# Patient Record
Sex: Male | Born: 1975 | Race: White | Hispanic: No | Marital: Single | Smoking: Never smoker
Health system: Southern US, Community
[De-identification: ages and names within clinical notes are randomized; demographics above are authoritative.]

## PROBLEM LIST (undated history)

## (undated) DIAGNOSIS — Z982 Presence of cerebrospinal fluid drainage device: Secondary | ICD-10-CM

## (undated) DIAGNOSIS — G809 Cerebral palsy, unspecified: Secondary | ICD-10-CM

---

## 2012-10-10 ENCOUNTER — Emergency Department (HOSPITAL_COMMUNITY): Payer: Medicaid Other

## 2012-10-10 ENCOUNTER — Emergency Department (HOSPITAL_COMMUNITY)
Admission: EM | Admit: 2012-10-10 | Discharge: 2012-10-10 | Disposition: A | Discharge status: Home / Self Care | Payer: Medicaid Other | Attending: Emergency Medicine | Admitting: Emergency Medicine

## 2012-10-10 ENCOUNTER — Encounter (HOSPITAL_COMMUNITY): Payer: Self-pay | Admitting: Emergency Medicine

## 2012-10-10 DIAGNOSIS — I10 Essential (primary) hypertension: Secondary | ICD-10-CM | POA: Insufficient documentation

## 2012-10-10 DIAGNOSIS — S99929A Unspecified injury of unspecified foot, initial encounter: Secondary | ICD-10-CM | POA: Insufficient documentation

## 2012-10-10 DIAGNOSIS — Z8679 Personal history of other diseases of the circulatory system: Secondary | ICD-10-CM | POA: Insufficient documentation

## 2012-10-10 DIAGNOSIS — S8990XA Unspecified injury of unspecified lower leg, initial encounter: Secondary | ICD-10-CM | POA: Insufficient documentation

## 2012-10-10 DIAGNOSIS — Z79899 Other long term (current) drug therapy: Secondary | ICD-10-CM | POA: Insufficient documentation

## 2012-10-10 DIAGNOSIS — G40909 Epilepsy, unspecified, not intractable, without status epilepticus: Secondary | ICD-10-CM | POA: Insufficient documentation

## 2012-10-10 DIAGNOSIS — W1809XA Striking against other object with subsequent fall, initial encounter: Secondary | ICD-10-CM | POA: Insufficient documentation

## 2012-10-10 DIAGNOSIS — Z23 Encounter for immunization: Secondary | ICD-10-CM | POA: Insufficient documentation

## 2012-10-10 DIAGNOSIS — Y9289 Other specified places as the place of occurrence of the external cause: Secondary | ICD-10-CM | POA: Insufficient documentation

## 2012-10-10 DIAGNOSIS — Z8669 Personal history of other diseases of the nervous system and sense organs: Secondary | ICD-10-CM | POA: Insufficient documentation

## 2012-10-10 DIAGNOSIS — M79671 Pain in right foot: Secondary | ICD-10-CM

## 2012-10-10 DIAGNOSIS — W19XXXA Unspecified fall, initial encounter: Secondary | ICD-10-CM

## 2012-10-10 DIAGNOSIS — Y9389 Activity, other specified: Secondary | ICD-10-CM | POA: Insufficient documentation

## 2012-10-10 HISTORY — DX: Cerebral palsy, unspecified: G80.9

## 2012-10-10 MED ORDER — TETANUS-DIPHTH-ACELL PERTUSSIS 5-2.5-18.5 LF-MCG/0.5 IM SUSP
0.5000 mL | Freq: Once | INTRAMUSCULAR | Status: AC
  Administered 2012-10-10: 0.5 mL via INTRAMUSCULAR
  Filled 2012-10-10: qty 0.5

## 2012-10-10 NOTE — ED Notes (Signed)
Placed call to [facility] for transport back to [facility] 

## 2012-10-10 NOTE — ED Provider Notes (Addendum)
History     CSN: [id number]  Arrival date & time 10/10/12  1530   First MD Initiated Contact with Patient 10/10/12 1608      Chief Complaint  Patient presents with  . 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  EMS placed him in a KED vest and C-collar.  He fell down 2 stairs in his wheelchair and hit his head falling face first.  He denies complaints (i.e pain, nausea, vomiting) and wants to go home.  He lives at [facility].   He complains of mild right heel pain after fall  Meds:  Tegretol 300 mg bid-Denies recent history of seizures Instill ear drops Mvt Lisinopril 2.5 mg qd Miralax  Colace   [facility] Right sided shunt  SH:  Only child, mother [phone]   Patient is a 37 y.o. male presenting with fall. The history is provided by the patient. No language interpreter was used.  Fall The accident occurred 1 to 2 hours ago. He landed on a hard floor. The point of impact was the head. Pertinent negatives include no abdominal pain, no nausea, no vomiting and no headaches.    Past Medical History  Diagnosis Date  . Cerebral palsy     History reviewed. No pertinent past surgical history.  History reviewed. No pertinent family history.  History  Substance Use Topics  . Smoking status: Never Smoker   . Smokeless tobacco: Not on file  . Alcohol Use: No      Review of Systems  Gastrointestinal: Negative for nausea, vomiting and abdominal pain.  Neurological: Negative for dizziness, seizures, light-headedness and headaches.  All other systems reviewed and are negative.    Allergies  Review of patient's allergies indicates no known allergies.  Home Medications   Current Outpatient Rx  Name  Route  Sig  Dispense  Refill  . CARBAMAZEPINE 100 MG PO CHEW  Oral   Chew 100 mg by mouth 2 (two) times daily.           BP 125/73  Pulse 85  Temp 98.8 F (37.1 C) (Oral)  Resp 14  SpO2 100%  Physical Exam  Nursing note and vitals reviewed. Constitutional: He is oriented to person, place, and time. Vital signs are normal. He appears well-developed and well-nourished. He is cooperative. No distress.  HENT:  Head: Normocephalic. Head is with abrasion.    Mouth/Throat: Oropharynx is clear and moist and mucous membranes are normal. No oropharyngeal exudate.  Eyes: Conjunctivae normal are normal. Pupils are equal, round, and reactive to light. Right eye exhibits no discharge. Left eye exhibits no discharge. No scleral icterus.  Cardiovascular: Normal rate, regular rhythm, S1 normal, S2 normal and normal heart sounds.   No murmur heard. Pulmonary/Chest: Effort normal and breath sounds normal. No respiratory distress. He has no wheezes.  Abdominal: Soft. Bowel sounds are normal. He exhibits no distension. There is no tenderness.         Scar to abdomen from shunt  Musculoskeletal:       Chronic contractures upper and lower extremities b/l  Neurological: He is alert and oriented to person, place, and time. He has normal strength.       Baseline motor strength  Skin: Skin is warm, dry and intact. No rash noted.  Psychiatric: He has a normal mood and affect. His speech is normal and behavior  is normal. Judgment and thought content normal. Cognition and memory are normal.       Baseline cognition and mental status     ED Course  Procedures (including critical care time)  Labs Reviewed - No data to display Dg Cervical Spine 2-3 Views  10/10/2012  *RADIOLOGY REPORT*  Clinical Data: Fall.  Motion degraded CT.  CERVICAL SPINE - 2-3 VIEW  Comparison: 10/10/2012 CT.  Findings: Mild straightening of the cervical spine with degenerative changes C5-6.  Slight tilt of the head.  No cervical spine fracture is noted.  Ventricular peritoneal shunt is in  place coursing over the right chest.  IMPRESSION: No cervical spine fracture.   Original Report Authenticated By: Lacy Duverney, M.D.    Ct Head Wo Contrast  10/10/2012  *RADIOLOGY REPORT*  Clinical Data:  Fall down 2 stairs falling out of wheelchair and hitting head.  Cerebral palsy.  CT HEAD WITHOUT CONTRAST CT CERVICAL SPINE WITHOUT CONTRAST  Technique:  Multidetector CT imaging of the head and cervical spine was performed following the standard protocol without intravenous contrast.  Multiplanar CT image reconstructions of the cervical spine were also generated.  Comparison:   None  CT HEAD  Findings: Motion degraded exam.  Exam repeated and still motion degraded.  Ventricular shunt enters from the right parietal region extending into the posterior aspect of the right lateral ventricle.  This causes significant streak artifact.  This combined with motion degradation limits evaluation.  No obvious intracranial hemorrhage or skull fracture.  Agenesis of the corpus callosum.  Atrophy without hydrocephalus.  No CT evidence of large acute infarct.  No intracranial mass lesion detected on this unenhanced exam.  IMPRESSION: Ventricular shunt enters from the right parietal region extending into the posterior aspect of the right lateral ventricle.  This causes significant streak artifact.  This combined with motion degradation limits evaluation.  No obvious intracranial hemorrhage or skull fracture.  Agenesis of the corpus callosum.  Atrophy without hydrocephalus.  Partial opacification inferior left mastoid air cells.  No fracture identified.  No mass seen at the left posterior-superior nasopharynx causing left sided eustachian tube dysfunction.  CT CERVICAL SPINE  Findings: Motion degraded exam.  Taking into account the limitation caused by motion artifact, no cervical spine fracture is noted.  Sagittal reconstructed imaging with indistinctness of the C2 vertebral body/dens junction.  If the patient has a significant  neck pain, plain film lateral view of the C2 level can be obtained to confirm that the appearance is related to motion rather than fracture.  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  IMPRESSION: Taking into account the limitation caused by motion artifact, no cervical spine fracture is noted.  Sagittal reconstructed imaging with indistinctness of the C2 vertebral body/dens junction.  If the patient has a significant neck pain, plain film lateral view of the C2 level can be obtained to confirm that the appearance is related to motion rather than fracture.  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   Original Report Authenticated By: Lacy Duverney, M.D.    Ct Cervical Spine Wo Contrast  10/10/2012  *RADIOLOGY REPORT*  Clinical Data:  Fall down 2 stairs falling out of wheelchair and hitting  head.  Cerebral palsy.  CT HEAD WITHOUT CONTRAST CT CERVICAL SPINE WITHOUT CONTRAST  Technique:  Multidetector CT imaging of the head and cervical spine was performed following the standard protocol without intravenous contrast.  Multiplanar CT image reconstructions of the cervical spine were also generated.  Comparison:   None  CT HEAD  Findings: Motion degraded exam.  Exam repeated and still motion degraded.  Ventricular shunt enters from the right parietal region extending into the posterior aspect of the right lateral ventricle.  This causes significant streak artifact.  This combined with motion degradation limits evaluation.  No obvious intracranial hemorrhage or skull fracture.  Agenesis of the corpus callosum.  Atrophy without hydrocephalus.  No CT evidence of large acute infarct.  No  intracranial mass lesion detected on this unenhanced exam.  IMPRESSION: Ventricular shunt enters from the right parietal region extending into the posterior aspect of the right lateral ventricle.  This causes significant streak artifact.  This combined with motion degradation limits evaluation.  No obvious intracranial hemorrhage or skull fracture.  Agenesis of the corpus callosum.  Atrophy without hydrocephalus.  Partial opacification inferior left mastoid air cells.  No fracture identified.  No mass seen at the left posterior-superior nasopharynx causing left sided eustachian tube dysfunction.  CT CERVICAL SPINE  Findings: Motion degraded exam.  Taking into account the limitation caused by motion artifact, no cervical spine fracture is noted.  Sagittal reconstructed imaging with indistinctness of the C2 vertebral body/dens junction.  If the patient has a significant neck pain, plain film lateral view of the C2 level can be obtained to confirm that the appearance is related to motion rather than fracture.  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  IMPRESSION: Taking into account the limitation caused by motion artifact, no cervical spine fracture is noted.  Sagittal reconstructed imaging with indistinctness of the C2 vertebral body/dens junction.  If the patient has a significant neck pain, plain film lateral view of the C2 level can be obtained to confirm that the appearance is related to motion rather than fracture.  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   Original Report Authenticated By: Lacy Duverney, M.D.    Dg Foot Complete Right  10/10/2012  *RADIOLOGY REPORT*   Clinical Data: Right heel pain after fall.  RIGHT FOOT COMPLETE - 3+ VIEW  Comparison: None.  Findings: Osteopenia.  Hallux valgus.  Degenerative changes in the first metatarsal phalangeal joint.  There is some bony overlap within the posterior calcaneus on the lateral view.  No additional evidence of acute fracture.  Pes planus.  IMPRESSION: Osteopenia makes evaluation for fracture challenging. Overlap of bone in the posterior calcaneus on the lateral view.  Cannot exclude fracture.   Original Report Authenticated By: Leanna Battles, M.D.      1. Fall   2. Pain of right heel       MDM  1. Status post fall likely mechanical Ct head w/o contrast, C spine, Tdap  2. Right heel pain -complete xray status post fall  -osteopenia  3. Dispo Home to [facility] MD [phone]          Annett Gula, MD 10/10/12 [phone]  Annett Gula, MD 10/10/12 1715  Annett Gula, MD 10/10/12 1823  Annett Gula, MD 10/10/12 1926  Annett Gula, MD 10/11/12 1325

## 2012-10-10 NOTE — ED Notes (Signed)
Pt to ED by EMS for fall. States fall forward down 2 steps while on a wheelchair. Bruise on forehead. No complaint. BP 108/60, HR 60

## 2012-10-10 NOTE — ED Notes (Signed)
Placed call to [facility] for transport back to [facility]

## 2012-10-10 NOTE — ED Provider Notes (Signed)
I saw and evaluated the patient, reviewed the resident's note and I agree with the findings and plan.  Hx cerebral palsy, fell down two steps in wheelchair. No LOC. Abrasion to forehead.  Denies complaints.  No vomiting. No neck, chest, back, abdominal pain. At neurological baseline.  Glynn Octave, MD 10/10/12 2041

## 2012-10-11 NOTE — ED Provider Notes (Signed)
I saw and evaluated the patient, reviewed the resident's note and I agree with the findings and plan.  See my additional note.  Glynn Octave, MD 10/11/12 [phone]

## 2015-12-17 ENCOUNTER — Emergency Department (HOSPITAL_COMMUNITY): Payer: Medicaid Other

## 2015-12-17 ENCOUNTER — Encounter (HOSPITAL_COMMUNITY): Payer: Self-pay | Admitting: Emergency Medicine

## 2015-12-17 ENCOUNTER — Emergency Department (HOSPITAL_COMMUNITY)
Admission: EM | Admit: 2015-12-17 | Discharge: 2015-12-17 | Disposition: A | Discharge status: Home / Self Care | Payer: Medicaid Other | Attending: Emergency Medicine | Admitting: Emergency Medicine

## 2015-12-17 DIAGNOSIS — Z79899 Other long term (current) drug therapy: Secondary | ICD-10-CM | POA: Diagnosis not present

## 2015-12-17 DIAGNOSIS — T8509XA Other mechanical complication of ventricular intracranial (communicating) shunt, initial encounter: Secondary | ICD-10-CM | POA: Diagnosis not present

## 2015-12-17 DIAGNOSIS — Z8669 Personal history of other diseases of the nervous system and sense organs: Secondary | ICD-10-CM | POA: Insufficient documentation

## 2015-12-17 DIAGNOSIS — Y658 Other specified misadventures during surgical and medical care: Secondary | ICD-10-CM | POA: Insufficient documentation

## 2015-12-17 DIAGNOSIS — H73892 Other specified disorders of tympanic membrane, left ear: Secondary | ICD-10-CM | POA: Insufficient documentation

## 2015-12-17 DIAGNOSIS — R519 Headache, unspecified: Secondary | ICD-10-CM

## 2015-12-17 DIAGNOSIS — R51 Headache: Secondary | ICD-10-CM | POA: Insufficient documentation

## 2015-12-17 HISTORY — DX: Presence of cerebrospinal fluid drainage device: Z98.2

## 2015-12-17 MED ORDER — DIPHENHYDRAMINE HCL 50 MG/ML IJ SOLN
12.5000 mg | Freq: Once | INTRAMUSCULAR | Status: AC
  Administered 2015-12-17: 12.5 mg via INTRAVENOUS
  Filled 2015-12-17: qty 1

## 2015-12-17 MED ORDER — AMOXICILLIN 500 MG PO CAPS: 500.0000 mg | ORAL_CAPSULE | Freq: Two times a day (BID) | ORAL | Status: AC

## 2015-12-17 MED ORDER — SODIUM CHLORIDE 0.9 % IV BOLUS (SEPSIS)
500.0000 mL | Freq: Once | INTRAVENOUS | Status: AC
  Administered 2015-12-17: 500 mL via INTRAVENOUS

## 2015-12-17 MED ORDER — KETOROLAC TROMETHAMINE 30 MG/ML IJ SOLN
30.0000 mg | Freq: Once | INTRAMUSCULAR | Status: AC
  Administered 2015-12-17: 30 mg via INTRAVENOUS
  Filled 2015-12-17: qty 1

## 2015-12-17 MED ORDER — METOCLOPRAMIDE HCL 5 MG/ML IJ SOLN
10.0000 mg | Freq: Once | INTRAMUSCULAR | Status: AC
  Administered 2015-12-17: 10 mg via INTRAVENOUS
  Filled 2015-12-17: qty 2

## 2015-12-17 MED ORDER — PREDNISONE 10 MG (21) PO TBPK: 10.0000 mg | ORAL_TABLET | Freq: Every day | ORAL | Status: AC

## 2015-12-17 MED ORDER — DEXAMETHASONE SODIUM PHOSPHATE 10 MG/ML IJ SOLN
10.0000 mg | Freq: Once | INTRAMUSCULAR | Status: AC
  Administered 2015-12-17: 10 mg via INTRAVENOUS
  Filled 2015-12-17: qty 1

## 2015-12-17 NOTE — Discharge Instructions (Signed)
Migraine Headache A migraine headache is an intense, throbbing pain on one or both sides of your head. A migraine can last for 30 minutes to several hours. CAUSES  The exact cause of a migraine headache is not always known. However, a migraine may be caused when nerves in the brain become irritated and release chemicals that cause inflammation. This causes pain. Certain things may also trigger migraines, such as:  Alcohol.  Smoking.  Stress.  Menstruation.  Aged cheeses.  Foods or drinks that contain nitrates, glutamate, aspartame, or tyramine.  Lack of sleep.  Chocolate.  Caffeine.  Hunger.  Physical exertion.  Fatigue.  Medicines used to treat chest pain (nitroglycerine), birth control pills, estrogen, and some blood pressure medicines. SIGNS AND SYMPTOMS  Pain on one or both sides of your head.  Pulsating or throbbing pain.  Severe pain that prevents daily activities.  Pain that is aggravated by any physical activity.  Nausea, vomiting, or both.  Dizziness.  Pain with exposure to bright lights, loud noises, or activity.  General sensitivity to bright lights, loud noises, or smells. Before you get a migraine, you may get warning signs that a migraine is coming (aura). An aura may include:  Seeing flashing lights.  Seeing bright spots, halos, or zigzag lines.  Having tunnel vision or blurred vision.  Having feelings of numbness or tingling.  Having trouble talking.  Having muscle weakness. DIAGNOSIS  A migraine headache is often diagnosed based on:  Symptoms.  Physical exam.  A CT scan or MRI of your head. These imaging tests cannot diagnose migraines, but they can help rule out other causes of headaches. TREATMENT Medicines may be given for pain and nausea. Medicines can also be given to help prevent recurrent migraines.  HOME CARE INSTRUCTIONS  Only take over-the-counter or prescription medicines for pain or discomfort as directed by your  health care provider. The use of long-term narcotics is not recommended.  Lie down in a dark, quiet room when you have a migraine.  Keep a journal to find out what may trigger your migraine headaches. For example, write down:  What you eat and drink.  How much sleep you get.  Any change to your diet or medicines.  Limit alcohol consumption.  Quit smoking if you smoke.  Get 7-9 hours of sleep, or as recommended by your health care provider.  Limit stress.  Keep lights dim if bright lights bother you and make your migraines worse. SEEK IMMEDIATE MEDICAL CARE IF:   Your migraine becomes severe.  You have a fever.  You have a stiff neck.  You have vision loss.  You have muscular weakness or loss of muscle control.  You start losing your balance or have trouble walking.  You feel faint or pass out.  You have severe symptoms that are different from your first symptoms. MAKE SURE YOU:   Understand these instructions.  Will watch your condition.  Will get help right away if you are not doing well or get worse.   This information is not intended to replace advice given to you by your health care provider. Make sure you discuss any questions you have with your health care provider.  Schedule appointment with Dr. Kendell Richardson, your neurosurgeon for Monday for reevaluation. Take steroids and antibiotics as prescribed. You will need to contact our medical records department to obtain copies of the imaging studies that were performed today. Please bring them with you to your appointment with Dr. Kendell Richardson. Return to  the emergency department if you experience severe worsening of her symptoms, increased headache, vomiting, blurry vision, numbness or tingling in extremity, confusion or increased lethargy.

## 2015-12-17 NOTE — ED Notes (Signed)
Patient transported to X-ray 

## 2015-12-17 NOTE — ED Provider Notes (Signed)
CSN: [id number]     Arrival date & time 12/17/15  5284 History   First MD Initiated Contact with Patient 12/17/15 [phone]     Chief Complaint  Patient presents with  . Headache     (Consider location/radiation/quality/duration/timing/severity/associated sxs/prior Treatment) HPI   Jimmy Richardson is a 40 year old male with history of several palsy, seizures who presents to the ED complaining of headache 1 week. Patient is accompanied by mother who provides most of the history. Patient is been experiencing a diffuse headache that has remained constant for over a week now. No associated change in vision, vomiting. However, mother states she noticed that his "eyes looked different as if they are not connected to each other". Patient has not been able to get in touch with his PCP so they brought him to the ER. Mother states the patient has history of a VP shunt that was placed when he was 40 years old. The shunt has been revised multiple times but has not been evaluated in 15 years. Mother is concerned that the shunt may be malfunctioning. Patient denies vomiting, increased lethargy. No new extremity weakness.   Past Medical History  Diagnosis Date  . Cerebral palsy (HCC)   . Intracranial shunt    History reviewed. No pertinent past surgical history. History reviewed. No pertinent family history. Social History  Substance Use Topics  . Smoking status: Never Smoker   . Smokeless tobacco: None  . Alcohol Use: No    Review of Systems  All other systems reviewed and are negative.     Allergies  Review of patient's allergies indicates no known allergies.  Home Medications   Prior to Admission medications   Medication Sig Start Date End Date Taking? Authorizing Provider  carbamazepine (TEGRETOL) 100 MG chewable tablet Chew 100 mg by mouth 2 (two) times daily.    Historical Provider, MD   BP 163/100 mmHg  Pulse 87  Temp(Src) 98.2 F (36.8 C) (Oral)  Resp 16  SpO2 97% Physical  Exam  Constitutional: He is oriented to person, place, and time. He appears well-developed and well-nourished. No distress.  HENT:  Head: Normocephalic and atraumatic.  Right Ear: External ear normal.  Left Ear: External ear normal.  Mouth/Throat: No oropharyngeal exudate.  Left TM erythematous. No mastoid tenderness of swelling. Right TM clear.  Eyes: Conjunctivae and EOM are normal. Pupils are equal, round, and reactive to light. Right eye exhibits no discharge. Left eye exhibits no discharge. No scleral icterus.  Neck: Neck supple.  Cardiovascular: Normal rate, regular rhythm, normal heart sounds and intact distal pulses.  Exam reveals no gallop and no friction rub.   No murmur heard. Pulmonary/Chest: Effort normal and breath sounds normal. No respiratory distress. He has no wheezes. He has no rales. He exhibits no tenderness.  Abdominal: Soft. Bowel sounds are normal. He exhibits no distension and no mass. There is no tenderness. There is no rebound and no guarding.  Musculoskeletal: Normal range of motion. He exhibits no edema.  Bilateral upper and lower extremity contractures present.  Lymphadenopathy:    He has no cervical adenopathy.  Neurological: He is alert and oriented to person, place, and time.  Skin: Skin is warm and dry. No rash noted. He is not diaphoretic. No erythema. No pallor.  Psychiatric: He has a normal mood and affect. His behavior is normal.  Nursing note and vitals reviewed.   ED Course  Procedures (including critical care time) Labs Review Labs Reviewed - No data to display  Imaging Review Dg Skull Complete  12/17/2015  CLINICAL DATA:  Headaches for 1 week, evaluate shunt catheter EXAM: SKULL - COMPLETE 4 + VIEW COMPARISON:  10/10/2012 FINDINGS: Right-sided ventriculostomy catheter is again identified. The visualized portions of the catheter appear intact. No bony abnormality is seen. IMPRESSION: Intact shunt catheter. Electronically Signed   By: Alcide CleverMark   Lukens M.D.   On: 12/17/2015 12:46   Dg Chest 2 View  12/17/2015  CLINICAL DATA:  Headache.  Evaluate for shunt dysfunction EXAM: CHEST  2 VIEW COMPARISON:  None. FINDINGS: There is a ventriculoperitoneal shunt which traverses the right neck and chest. Dystrophic calcifications around the catheter at the level of the neck without visible discontinuity. No cervical spine image was obtained, but on head CT scout image the catheter is visible and appears continuous. Normal heart size and mediastinal contours. No acute infiltrate or edema. No effusion or pneumothorax. No acute osseous findings. IMPRESSION: Ventriculoperitoneal shunt catheter with continuous tubing. Dystrophic calcifications around the catheter at the neck. No acute cardiopulmonary disease. Electronically Signed   By: Marnee SpringJonathon  Watts M.D.   On: 12/17/2015 11:48   Dg Abd 1 View  12/17/2015  CLINICAL DATA:  VP shunt.  Headache. EXAM: ABDOMEN - 1 VIEW COMPARISON:  None. FINDINGS: Shunt tubing extends into the abdomen and is coiled in the right upper quadrant. Shunt tubing is continuous. There is a separate disconnected segment of tubing in the left abdomen and pelvis presumably from a discontinued shunt. Gas in large and small bowel. Moderate stool in the right colon. No acute skeletal abnormality IMPRESSION: Shunt tubing coiled in the right upper quadrant. Separate isolated segment of tubing in the left abdomen and pelvis likely a discontinued segment of shunt tubing. Electronically Signed   By: Marlan Palauharles  Clark M.D.   On: 12/17/2015 11:46   Ct Head Wo Contrast  12/17/2015  ADDENDUM REPORT: 12/17/2015 12:10 ADDENDUM: Slight offset at the tubing shunt connector may be a kink, but skull radiographs have been recommended. These results were called by telephone at the time of interpretation on 12/17/2015 at 12:10 pm to Dr. Gaylyn RongSAMANTHA DOWLESS , who agrees. Electronically Signed   By: Marnee SpringJonathon  Watts M.D.   On: 12/17/2015 12:10  12/17/2015  CLINICAL DATA:   History of VP shunt.  Intermittent headache EXAM: CT HEAD WITHOUT CONTRAST TECHNIQUE: Contiguous axial images were obtained from the base of the skull through the vertex without intravenous contrast. COMPARISON:  10/10/2012 FINDINGS: Skull and Sinuses:Right parietal burr hole for VP shunt. Stable catheter positioning. Scar-like appearance around the catheter without evidence of fluid. Left mastoid and middle ear opacification without erosion. Clear nasopharynx. Visualized orbits: Negative. Brain: Stable positioning of ventriculoperitoneal shunt catheter, tip in the midline after traversing the atrium of the right lateral ventricle. Although there is no overt ventriculomegaly, lateral ventricular volume has increased from prior, 12 mm diameter at the frontal horn of the left lateral ventricle compared to 4 mm previously. No evidence of transependymal CSF flow. Stable pattern of preferential collapse/ drainage of the right lateral ventricle. History of cerebral palsy with paucity of periventricular white matter, low generalized brain volume, and stable dysmorphic/ deficient appearance of the corpus callosum. No evidence of acute infarct, hemorrhage, or mass lesion. IMPRESSION: 1. No overt ventriculomegaly but lateral ventricular volume has increased since only comparison 10/10/2012. Shunt dysfunction is diagnostic possibility. 2. Otherwise stable appearance of the brain. 3. Left otomastoiditis. Electronically Signed: By: Marnee SpringJonathon  Watts M.D. On: 12/17/2015 11:42   I have personally reviewed and evaluated  these images and lab results as part of my medical decision-making.   EKG Interpretation None      MDM   Final diagnoses:  Acute intractable headache, unspecified headache type  Obstructed VP shunt, initial encounter    40 year old male with history of cerebral palsy, seizures presents the ED with diffuse headache 1 week. Patient was unable to see PCP. Patient is accompanied by mother who provides  most of the history. Per mother patient's eyes appear "disconnected". Unable to tell what patient's neurologic baseline is. He is able to answer all questions in the ED. He is alert and oriented 3. Patient has history of VP shunt placement which has not been revised in 15 years. Concern for obstruction or shunt dysfunction. We'll obtain CT head and shunt series. Patient is given migraine cocktail including Reglan, Benadryl, Toradol, Decadron and fluids.  Upon reexamination, patient states his headache has completely resolved. Spoke with radiology who states that on CT patient's ventricular volume has increased since last imaging which was 7 2014. Shunt dysfunction is diagnostic possibility. No obvious shunt discontinuation on other radiographs. There is also a evidence of left otomastoiditis on CT. Patient denies any ear pain but left TM does appear to be erythematous. We'll treat with antibiotics. I spoke with Dr. Kendell Bane who was patient's neurosurgeon who performed a VP shunt procedure over 30 years ago. He states that patient may need revision of his VP shunt but not necessarily as an emergency. Recommends placing patient on steroid taper pack and he will see him in his office on Monday. Discussed this treatment plan with family who is agreeable. Return precautions outlined in patient discharge instructions.     Lester Kinsman [place], PA-C 12/18/15 1820  Lyndal Pulley, MD 12/19/15 [phone]

## 2015-12-17 NOTE — Progress Notes (Addendum)
[facility] [address] [place], [place] [id number] [phone] is listed as pt pcp
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
## 2019-08-02 ENCOUNTER — Encounter (HOSPITAL_COMMUNITY): Payer: Self-pay | Admitting: Emergency Medicine

## 2019-08-02 ENCOUNTER — Ambulatory Visit (INDEPENDENT_AMBULATORY_CARE_PROVIDER_SITE_OTHER)
Admission: EM | Admit: 2019-08-02 | Discharge: 2019-08-02 | Disposition: A | Discharge status: Home / Self Care | Payer: Medicare Other | Source: Home / Self Care | Attending: Family Medicine | Admitting: Family Medicine

## 2019-08-02 ENCOUNTER — Other Ambulatory Visit: Payer: Self-pay

## 2019-08-02 ENCOUNTER — Emergency Department (HOSPITAL_COMMUNITY): Payer: Medicare Other

## 2019-08-02 ENCOUNTER — Emergency Department (HOSPITAL_COMMUNITY)
Admission: EM | Admit: 2019-08-02 | Discharge: 2019-08-03 | Disposition: A | Discharge status: Home / Self Care | Payer: Medicare Other | Attending: Emergency Medicine | Admitting: Emergency Medicine

## 2019-08-02 ENCOUNTER — Ambulatory Visit (INDEPENDENT_AMBULATORY_CARE_PROVIDER_SITE_OTHER): Payer: Medicare Other

## 2019-08-02 DIAGNOSIS — M545 Low back pain, unspecified: Secondary | ICD-10-CM

## 2019-08-02 DIAGNOSIS — Y92410 Unspecified street and highway as the place of occurrence of the external cause: Secondary | ICD-10-CM | POA: Diagnosis not present

## 2019-08-02 DIAGNOSIS — S3992XA Unspecified injury of lower back, initial encounter: Secondary | ICD-10-CM | POA: Diagnosis present

## 2019-08-02 DIAGNOSIS — Y999 Unspecified external cause status: Secondary | ICD-10-CM | POA: Diagnosis not present

## 2019-08-02 DIAGNOSIS — Y939 Activity, unspecified: Secondary | ICD-10-CM | POA: Diagnosis not present

## 2019-08-02 DIAGNOSIS — G809 Cerebral palsy, unspecified: Secondary | ICD-10-CM | POA: Insufficient documentation

## 2019-08-02 DIAGNOSIS — S32000A Wedge compression fracture of unspecified lumbar vertebra, initial encounter for closed fracture: Secondary | ICD-10-CM | POA: Diagnosis not present

## 2019-08-02 DIAGNOSIS — K5900 Constipation, unspecified: Secondary | ICD-10-CM | POA: Insufficient documentation

## 2019-08-02 DIAGNOSIS — Z79899 Other long term (current) drug therapy: Secondary | ICD-10-CM | POA: Insufficient documentation

## 2019-08-02 LAB — CBC WITH DIFFERENTIAL/PLATELET
Abs Immature Granulocytes: 0.05 10*3/uL (ref 0.00–0.07)
Basophils Absolute: 0 10*3/uL (ref 0.0–0.1)
Basophils Relative: 0 %
Eosinophils Absolute: 0 10*3/uL (ref 0.0–0.5)
Eosinophils Relative: 0 %
HCT: 44.5 % (ref 39.0–52.0)
Hemoglobin: 14.7 g/dL (ref 13.0–17.0)
Immature Granulocytes: 1 %
Lymphocytes Relative: 9 %
Lymphs Abs: 0.8 10*3/uL (ref 0.7–4.0)
MCH: 30.4 pg (ref 26.0–34.0)
MCHC: 33 g/dL (ref 30.0–36.0)
MCV: 92.1 fL (ref 80.0–100.0)
Monocytes Absolute: 0.6 10*3/uL (ref 0.1–1.0)
Monocytes Relative: 6 %
Neutro Abs: 8.2 10*3/uL — ABNORMAL HIGH (ref 1.7–7.7)
Neutrophils Relative %: 84 %
Platelets: 169 10*3/uL (ref 150–400)
RBC: 4.83 MIL/uL (ref 4.22–5.81)
RDW: 12.3 % (ref 11.5–15.5)
WBC: 9.6 10*3/uL (ref 4.0–10.5)
nRBC: 0 % (ref 0.0–0.2)

## 2019-08-02 LAB — COMPREHENSIVE METABOLIC PANEL
ALT: 37 U/L (ref 0–44)
AST: 89 U/L — ABNORMAL HIGH (ref 15–41)
Albumin: 4.1 g/dL (ref 3.5–5.0)
Alkaline Phosphatase: 120 U/L (ref 38–126)
Anion gap: 12 (ref 5–15)
BUN: 15 mg/dL (ref 6–20)
CO2: 19 mmol/L — ABNORMAL LOW (ref 22–32)
Calcium: 9.1 mg/dL (ref 8.9–10.3)
Chloride: 108 mmol/L (ref 98–111)
Creatinine, Ser: 0.98 mg/dL (ref 0.61–1.24)
GFR calc Af Amer: >60 mL/min (ref >60)
GFR calc non Af Amer: >60 mL/min (ref >60)
Glucose, Bld: 121 mg/dL — ABNORMAL HIGH (ref 70–99)
Potassium: 3.7 mmol/L (ref 3.5–5.1)
Sodium: 139 mmol/L (ref 135–145)
Total Bilirubin: 1.1 mg/dL (ref 0.3–1.2)
Total Protein: 7.2 g/dL (ref 6.5–8.1)

## 2019-08-02 MED ORDER — ACETAMINOPHEN 325 MG PO TABS
650.0000 mg | ORAL_TABLET | Freq: Once | ORAL | Status: AC
  Administered 2019-08-02: 650 mg via ORAL
  Filled 2019-08-02: qty 2
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
## 2019-08-02 NOTE — Discharge Instructions (Signed)
Your xray is questionable for compression fractures of the spine and it is recommended that you get a CT for further evaluation and management.
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
## 2019-08-03 DIAGNOSIS — S32000A Wedge compression fracture of unspecified lumbar vertebra, initial encounter for closed fracture: Secondary | ICD-10-CM | POA: Diagnosis not present

## 2019-08-03 MED ORDER — POLYETHYLENE GLYCOL 3350 17 G PO PACK: 17.0000 g | PACK | Freq: Two times a day (BID) | ORAL | 0 refills | Status: AC

## 2019-08-03 MED ORDER — LIDOCAINE 5 % EX PTCH
1.0000 | MEDICATED_PATCH | CUTANEOUS | Status: DC
  Administered 2019-08-03: 1 via TRANSDERMAL
  Filled 2019-08-03: qty 1

## 2019-08-03 MED ORDER — IOHEXOL 300 MG/ML  SOLN
100.0000 mL | Freq: Once | INTRAMUSCULAR | Status: AC | PRN
  Administered 2019-08-03: 100 mL via INTRAVENOUS

## 2019-08-03 NOTE — Progress Notes (Signed)
Orthopedic Tech Progress Note Patient Details:  Jimmy Richardson 1975-12-30 [id number]  Patient ID: Jimmy Richardson, male   DOB: [birth year], 43 y.o.   MRN: [id number] Applied tlso. Instructed caregiver on application and removal of brace. Routed order to [facility].  Karolee Stamps 08/03/2019, 3:00 AM

## 2019-09-06 DEATH — deceased
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
